# Patient Record
Sex: Male | Born: 2000 | Race: Asian | Hispanic: No | Marital: Single | Smoking: Never smoker
Health system: Southern US, Community
[De-identification: ages and names within clinical notes are randomized; demographics above are authoritative.]

---
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
## 2019-04-19 ENCOUNTER — Other Ambulatory Visit: Payer: Self-pay

## 2019-04-19 DIAGNOSIS — Z20822 Contact with and (suspected) exposure to covid-19: Secondary | ICD-10-CM

## 2019-04-21 LAB — NOVEL CORONAVIRUS, NAA: SARS-CoV-2, NAA: NOT DETECTED

## 2020-08-25 ENCOUNTER — Other Ambulatory Visit: Payer: Self-pay

## 2020-08-25 ENCOUNTER — Encounter: Payer: Self-pay | Admitting: Emergency Medicine

## 2020-08-25 ENCOUNTER — Emergency Department (INDEPENDENT_AMBULATORY_CARE_PROVIDER_SITE_OTHER)
Admission: EM | Admit: 2020-08-25 | Discharge: 2020-08-25 | Disposition: A | Discharge status: Home / Self Care | Payer: Medicaid Other | Source: Home / Self Care | Attending: Family Medicine | Admitting: Family Medicine

## 2020-08-25 DIAGNOSIS — J111 Influenza due to unidentified influenza virus with other respiratory manifestations: Secondary | ICD-10-CM

## 2020-08-25 MED ORDER — OSELTAMIVIR PHOSPHATE 75 MG PO CAPS: 75.0000 mg | ORAL_CAPSULE | Freq: Two times a day (BID) | ORAL | 0 refills | Status: AC

## 2020-08-25 NOTE — ED Provider Notes (Signed)
[facility]    CSN: [id number] Arrival date & time: 08/25/20  1422      History   Chief Complaint Chief Complaint  Patient presents with  . Sore Throat    HPI Shizuo Biskup is a 20 y.o. male.   Two days ago patient developed nasal drainage, headache, fatigue, and cough.  Yesterday he developed a sore throat, hoarseness, and chills.  He denies pleuritic pain or shortness of breath.  He has had COVID19 vaccinations.  The history is provided by the patient.    History reviewed. No pertinent past medical history.  There are no problems to display for this patient.   History reviewed. No pertinent surgical history.     Home Medications    Prior to Admission medications   Medication Sig Start Date End Date Taking? Authorizing Provider  cetirizine (ZYRTEC) 10 MG tablet Take 10 mg by mouth daily.   Yes [provider]  oseltamivir (TAMIFLU) 75 MG capsule Take 1 capsule (75 mg total) by mouth 2 (two) times daily for 5 days. 08/25/20 08/30/20 Yes Lattie Haw, MD  pseudoephedrine-acetaminophen (TYLENOL SINUS) 30-500 MG TABS tablet Take 1 tablet by mouth every 4 (four) hours as needed.   Yes [provider]    Family History Family History  Problem Relation Age of Onset  . Healthy Mother   . Diabetes Father     Social History Social History   Tobacco Use  . Smoking status: Never Smoker  . Smokeless tobacco: Never Used  Substance Use Topics  . Drug use: Not Currently     Allergies   Patient has no known allergies.   Review of Systems Review of Systems + sore throat + hoarse + cough No pleuritic pain No wheezing + nasal congestion + post-nasal drainage No sinus pain/pressure No itchy/red eyes No earache No hemoptysis No SOB No fever, + chills No nausea No vomiting No abdominal pain No diarrhea No urinary symptoms No skin rash + fatigue No myalgias + headache Used OTC meds (Theraflu) without relief    Physical Exam Triage Vital Signs ED Triage Vitals  Enc Vitals Group     BP 08/25/20 1441 104/70     Pulse Rate 08/25/20 1441 86     Resp 08/25/20 1441 20     Temp 08/25/20 1441 98.9 F (37.2 C)     Temp Source 08/25/20 1441 Oral     SpO2 08/25/20 1441 99 %     Weight 08/25/20 1442 280 lb (127 kg)     Height 08/25/20 1442 5\' 7"  (1.702 m)     Head Circumference --      Peak Flow --      Pain Score 08/25/20 1442 2     Pain Loc --      Pain Edu? --      Excl. in GC? --    No data found.  Updated Vital Signs BP 104/70 (BP Location: Right Arm)   Pulse 86   Temp 98.9 F (37.2 C) (Oral)   Resp 20   Ht 5\' 7"  (1.702 m)   Wt 127 kg   SpO2 99%   BMI 43.85 kg/m   Visual Acuity Right Eye Distance:   Left Eye Distance:   Bilateral Distance:    Right Eye Near:   Left Eye Near:    Bilateral Near:     Physical Exam Nursing notes and Vital Signs reviewed. Appearance:  Patient appears stated age, and in no acute distress  Eyes:  Pupils are equal, round, and reactive to light and accomodation.  Extraocular movement is intact.  Conjunctivae are not inflamed  Ears:  Canals normal.  Tympanic membranes normal.  Nose:  Mildly congested turbinates.  No sinus tenderness.  Pharynx:  Normal Neck:  Supple.  Mildly enlarged lateral nodes are present, tender to palpation on the left.   Lungs:  Clear to auscultation.  Breath sounds are equal.  Moving air well. Heart:  Regular rate and rhythm without murmurs, rubs, or gallops.  Abdomen:  Nontender without masses or hepatosplenomegaly.  Bowel sounds are present.  No CVA or flank tenderness.  Extremities:  No edema.  Skin:  No rash present.   UC Treatments / Results  Labs (all labs ordered are listed, but only abnormal results are displayed) Labs Reviewed - No data to display  EKG   Radiology No results found.  Procedures Procedures (including critical care time)  Medications Ordered in UC Medications - No data to  display  Initial Impression / Assessment and Plan / UC Course  I have reviewed the triage vital signs and the nursing notes.  Pertinent labs & imaging results that were available during my care of the patient were reviewed by me and considered in my medical decision making (see chart for details).    Benign exam.  There is no evidence of bacterial infection today.   COVID19 PCR and Flu A/B pending. Begin empiric Tamiflu. Followup with Family Doctor if not improved in one week.    Final Clinical Impressions(s) / UC Diagnoses   Final diagnoses:  Influenza-like illness     Discharge Instructions     Take plain guaifenesin (1200mg  extended release tabs such as Mucinex) twice daily, with plenty of water, for cough and congestion.  May add Pseudoephedrine (30mg , one or two every 4 to 6 hours) for sinus congestion.  Get adequate rest.   May use Afrin nasal spray (or generic oxymetazoline) each morning for about 5 days and then discontinue.  Also recommend using saline nasal spray several times daily and saline nasal irrigation (AYR is a common brand).  Use Flonase nasal spray each morning after using Afrin nasal spray and saline nasal irrigation. Try warm salt water gargles for sore throat.  Stop all antihistamines for now, and other non-prescription cough/cold preparations. May take Ibuprofen 200mg , 4 tabs every 8 hours with food for body aches, headache, fever, etc. May take Delsym Cough Suppressant ("12 Hour Cough Relief") at bedtime for nighttime cough.   If your COVID-19 test is positive, isolate yourself for five days from the time of your symptom onset.  At the end of five days you may end isolation if your symptoms have cleared or improved, and you have not had a fever for 24 hours. At this time you should wear a mask for five more days when you are around others.   If symptoms become significantly worse during the night or over the weekend, proceed to the local emergency room.          ED Prescriptions    Medication Sig Dispense Auth. Provider   oseltamivir (TAMIFLU) 75 MG capsule Take 1 capsule (75 mg total) by mouth 2 (two) times daily for 5 days. 10 capsule , MD        , MD 08/27/20 [id number]

## 2020-08-25 NOTE — ED Triage Notes (Signed)
Sore throat, chills,cough x 2days Vaccinated

## 2020-08-25 NOTE — Discharge Instructions (Addendum)
Take plain guaifenesin (1200mg  extended release tabs such as Mucinex) twice daily, with plenty of water, for cough and congestion.  May add Pseudoephedrine (30mg , one or two every 4 to 6 hours) for sinus congestion.  Get adequate rest.   May use Afrin nasal spray (or generic oxymetazoline) each morning for about 5 days and then discontinue.  Also recommend using saline nasal spray several times daily and saline nasal irrigation (AYR is a common brand).  Use Flonase nasal spray each morning after using Afrin nasal spray and saline nasal irrigation. Try warm salt water gargles for sore throat.  Stop all antihistamines for now, and other non-prescription cough/cold preparations. May take Ibuprofen 200mg , 4 tabs every 8 hours with food for body aches, headache, fever, etc. May take Delsym Cough Suppressant ("12 Hour Cough Relief") at bedtime for nighttime cough.   If your COVID-19 test is positive, isolate yourself for five days from the time of your symptom onset.  At the end of five days you may end isolation if your symptoms have cleared or improved, and you have not had a fever for 24 hours. At this time you should wear a mask for five more days when you are around others.   If symptoms become significantly worse during the night or over the weekend, proceed to the local emergency room.

## 2020-08-27 LAB — COVID-19, FLU A+B NAA
Influenza A, NAA: NOT DETECTED
Influenza B, NAA: NOT DETECTED
SARS-CoV-2, NAA: NOT DETECTED

## 2021-06-05 ENCOUNTER — Emergency Department (INDEPENDENT_AMBULATORY_CARE_PROVIDER_SITE_OTHER)
Admission: EM | Admit: 2021-06-05 | Discharge: 2021-06-05 | Disposition: A | Discharge status: Home / Self Care | Payer: BC Managed Care – PPO | Source: Home / Self Care | Attending: Family Medicine | Admitting: Family Medicine

## 2021-06-05 ENCOUNTER — Emergency Department (INDEPENDENT_AMBULATORY_CARE_PROVIDER_SITE_OTHER): Payer: BC Managed Care – PPO

## 2021-06-05 ENCOUNTER — Encounter: Payer: Self-pay | Admitting: Emergency Medicine

## 2021-06-05 ENCOUNTER — Other Ambulatory Visit: Payer: Self-pay

## 2021-06-05 DIAGNOSIS — R42 Dizziness and giddiness: Secondary | ICD-10-CM

## 2021-06-05 DIAGNOSIS — R0602 Shortness of breath: Secondary | ICD-10-CM | POA: Diagnosis not present

## 2021-06-05 DIAGNOSIS — I9589 Other hypotension: Secondary | ICD-10-CM

## 2021-06-05 LAB — POCT URINALYSIS DIP (MANUAL ENTRY)
Bilirubin, UA: NEGATIVE
Blood, UA: NEGATIVE
Glucose, UA: NEGATIVE mg/dL
Ketones, POC UA: NEGATIVE mg/dL
Leukocytes, UA: NEGATIVE
Nitrite, UA: NEGATIVE
Protein Ur, POC: NEGATIVE mg/dL
Spec Grav, UA: 1.025 (ref 1.010–1.025)
Urobilinogen, UA: 0.2 E.U./dL
pH, UA: 7 (ref 5.0–8.0)

## 2021-06-05 LAB — POCT INFLUENZA A/B
Influenza A, POC: NEGATIVE
Influenza B, POC: NEGATIVE

## 2021-06-05 LAB — POC SARS CORONAVIRUS 2 AG -  ED: SARS Coronavirus 2 Ag: NEGATIVE

## 2021-06-05 LAB — POCT FASTING CBG KUC MANUAL ENTRY: POCT Glucose (KUC): 94 mg/dL (ref 70–99)

## 2021-06-05 NOTE — Discharge Instructions (Signed)
The EKG is normal The chest x-ray is normal Your blood sugar is normal The urine test does show you could drink more water, although you are not dehydrated Your blood work will be available tomorrow You need to reduce the strenuous exercise, stop as soon as you feel lightheaded or dizzy 1 or 2 days off a week is advisable Follow-up with your primary care doctor

## 2021-06-05 NOTE — ED Triage Notes (Signed)
Dizziness, headache, diarrhea works out a lot, eats healthy only drinks alcohol on weekends. Has been sick for 3 weeks. Vaccinated for Covid no flu shot. One episode of diarrhea in 24 hours.

## 2021-06-05 NOTE — ED Provider Notes (Signed)
[facility]    CSN: [id number] Arrival date & time: 06/05/21  1526      History   Chief Complaint Chief Complaint  Patient presents with   Dizziness    HPI Juan Powell is a 21 y.o. male.   HPI  Patient is a healthy 21 year old man on no medicine except nutrition supplements.  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  No ongoing medical problems For the last 2 weeks or so he has had increasing difficulty with his workouts.  He states that when he works out he feels very weak and tired.  Has to sit down and cool air.  Feels like he will faint.  Feels lightheaded.  No chest pain.  No shortness of breath.  No recent illness.  No fever or chills.  He has a decreased appetite.  Has vomited a few times.  He has had diarrhea a few times.  He tries to drink enough water and does not think he is dehydrated His sleep habits have not changed.  He tends to stay up late He does not think his weight is changed His exercise tolerance has not changed, he states he lifts the same amount of weight He has not had any signs or symptoms of infection such as fever or chills, headaches or body aches, cough cold or runny nose He states his muscles are sore the morning after he works out.  This does not limit his daily exercise  He did have an endocrinology visit when he was 21 years old.  He was seen for delayed growth.  He did have a acanthosis nigricans and a hemoglobin A1c of 5.6.  Endocrinologist predicted higher risk of diabetes He does not have polydipsia or polyuria  History reviewed. No pertinent past medical history.  There are no problems to display for this patient.   History reviewed. No pertinent surgical history.     Home Medications    Prior to Admission medications   Medication Sig Start Date End Date Taking? Authorizing Provider  ibuprofen (ADVIL) 400 MG tablet Take 400 mg by mouth  every 6 (six) hours as needed.   Yes [provider]    Family History Family History  Problem Relation Age of Onset   Healthy Mother    Diabetes Father     Social History Social History   Tobacco Use   Smoking status: Never   Smokeless tobacco: Never  Vaping Use   Vaping Use: Never used  Substance Use Topics   Alcohol use: Yes   Drug use: Not Currently     Allergies   Patient has no known allergies.   Review of Systems Review of Systems  See HPI Physical Exam Triage Vital Signs ED Triage Vitals  Enc Vitals Group     BP 06/05/21 1540 105/72     Pulse Rate 06/05/21 1540 78     Resp 06/05/21 1540 18     Temp 06/05/21 1540 98.4 F (36.9 C)     Temp Source 06/05/21 1540 Oral     SpO2 06/05/21 1540 97 %     Weight 06/05/21 1541 280 lb (127 kg)     Height 06/05/21 1541 5\' 8"  (1.727 m)     Head Circumference --      Peak Flow --      Pain Score 06/05/21 1541 0  Pain Loc --      Pain Edu? --      Excl. in GC? --    Orthostatic VS for the past 24 hrs:  BP- Lying Pulse- Lying BP- Sitting Pulse- Sitting BP- Standing at 0 minutes Pulse- Standing at 0 minutes  06/05/21 1644 110/76 60 106/73 71 112/74 78    Updated Vital Signs BP 105/72 (BP Location: Left Arm)    Pulse 78    Temp 98.4 F (36.9 C) (Oral)    Resp 18    Ht 5\' 8"  (1.727 m)    Wt 127 kg    SpO2 97%    BMI 42.57 kg/m      Physical Exam Constitutional:      General: He is not in acute distress.    Appearance: He is well-developed. He is obese.     Comments: Patient is stocky and muscular, but does have extra weight.  Mildly obese  HENT:     Head: Normocephalic and atraumatic.     Right Ear: Tympanic membrane and ear canal normal.     Left Ear: Tympanic membrane and ear canal normal.     Nose: Nose normal.     Mouth/Throat:     Mouth: Mucous membranes are moist.     Pharynx: No posterior oropharyngeal erythema.  Eyes:     Conjunctiva/sclera: Conjunctivae normal.     Pupils: Pupils  are equal, round, and reactive to light.  Cardiovascular:     Rate and Rhythm: Normal rate and regular rhythm.     Heart sounds: Normal heart sounds.  Pulmonary:     Effort: Pulmonary effort is normal. No respiratory distress.     Breath sounds: Normal breath sounds. No wheezing, rhonchi or rales.  Abdominal:     General: There is no distension.     Palpations: Abdomen is soft.     Tenderness: There is no abdominal tenderness.     Hernia: No hernia is present.     Comments: No tenderness.  No organomegaly  Musculoskeletal:        General: No swelling or tenderness. Normal range of motion.     Cervical back: Normal range of motion.     Right lower leg: No edema.     Left lower leg: No edema.  Lymphadenopathy:     Cervical: No cervical adenopathy.  Skin:    General: Skin is warm and dry.     Findings: No rash.  Neurological:     General: No focal deficit present.     Mental Status: He is alert.     Gait: Gait normal.  Psychiatric:        Mood and Affect: Mood normal.        Behavior: Behavior normal.     UC Treatments / Results  Labs (all labs ordered are listed, but only abnormal results are displayed) Labs Reviewed  CBC WITH DIFFERENTIAL/PLATELET  COMPLETE METABOLIC PANEL WITH GFR  CK  POCT URINALYSIS DIP (MANUAL ENTRY)  POCT FASTING CBG K[facility] MANUAL ENTRY  POCT INFLUENZA A/B  POC SARS CORONAVIRUS 2 AG -  ED    EKG   Radiology DG Chest 2 View  Result Date: 06/05/2021 CLINICAL DATA:  Shortness of breath and dizziness with exertion. EXAM: CHEST - 2 VIEW COMPARISON:  None. FINDINGS: The heart size and mediastinal contours are within normal limits. Both lungs are clear. The visualized skeletal structures are unremarkable. IMPRESSION: No active cardiopulmonary disease. Electronically Signed   By: 06/07/2021  Derry M.D.   On: 06/05/2021 16:58    Procedures ED EKG  Date/Time: 06/05/2021 5:03 PM Performed by: Eustace MooreNelson, Lambros Cerro Sue, MD Authorized by: Eustace MooreNelson, Parvin Stetzer Sue, MD    ECG reviewed by ED Physician in the absence of a cardiologist: no   Previous ECG:    Previous ECG:  Unavailable Interpretation:    Interpretation: normal   Rate:    ECG rate:  63   ECG rate assessment: normal   Rhythm:    Rhythm: sinus rhythm   QRS:    QRS axis:  Normal   QRS intervals:  Normal   QRS conduction: normal   ST segments:    ST segments:  Normal T waves:    T waves: normal   Q waves:    Abnormal Q-waves: not present   (including critical care time)  Medications Ordered in UC Medications - No data to display  Initial Impression / Assessment and Plan / UC Course  I have reviewed the triage vital signs and the nursing notes.  Pertinent labs & imaging results that were available during my care of the patient were reviewed by me and considered in my medical decision making (see chart for details).     Exercise-induced dizziness is worrisome to me.  At peak exercise his cardiovascular system should be maintaining good circulatory status.  He may have a virus or a temporary condition.  All testing today is negative.  Follow-up with usual primary care. Final Clinical Impressions(s) / UC Diagnoses   Final diagnoses:  Dizzy  Exercise induced hypotension     Discharge Instructions      The EKG is normal The chest x-ray is normal Your blood sugar is normal The urine test does show you could drink more water, although you are not dehydrated Your blood work will be available tomorrow You need to reduce the strenuous exercise, stop as soon as you feel lightheaded or dizzy 1 or 2 days off a week is advisable Follow-up with your primary care doctor     ED Prescriptions   None    PDMP not reviewed this encounter.   Eustace MooreNelson, Emy Angevine Sue, MD 06/05/21 [phone]

## 2021-06-06 LAB — CBC WITH DIFFERENTIAL/PLATELET
Absolute Monocytes: 422 cells/uL (ref 200–950)
Basophils Absolute: 82 cells/uL (ref 0–200)
Basophils Relative: 1.2 %
Eosinophils Absolute: 428 cells/uL (ref 15–500)
Eosinophils Relative: 6.3 %
HCT: 44.2 % (ref 38.5–50.0)
Hemoglobin: 14.9 g/dL (ref 13.2–17.1)
Lymphs Abs: 1625 cells/uL (ref 850–3900)
MCH: 29 pg (ref 27.0–33.0)
MCHC: 33.7 g/dL (ref 32.0–36.0)
MCV: 86 fL (ref 80.0–100.0)
MPV: 10.4 fL (ref 7.5–12.5)
Monocytes Relative: 6.2 %
Neutro Abs: 4243 cells/uL (ref 1500–7800)
Neutrophils Relative %: 62.4 %
Platelets: 299 10*3/uL (ref 140–400)
RBC: 5.14 10*6/uL (ref 4.20–5.80)
RDW: 12.6 % (ref 11.0–15.0)
Total Lymphocyte: 23.9 %
WBC: 6.8 10*3/uL (ref 3.8–10.8)

## 2021-06-06 LAB — COMPLETE METABOLIC PANEL WITH GFR
AG Ratio: 1.4 (calc) (ref 1.0–2.5)
ALT: 17 U/L (ref 9–46)
AST: 18 U/L (ref 10–40)
Albumin: 4.7 g/dL (ref 3.6–5.1)
Alkaline phosphatase (APISO): 82 U/L (ref 36–130)
BUN: 18 mg/dL (ref 7–25)
CO2: 29 mmol/L (ref 20–32)
Calcium: 9.8 mg/dL (ref 8.6–10.3)
Chloride: 105 mmol/L (ref 98–110)
Creat: 1.23 mg/dL (ref 0.60–1.24)
Globulin: 3.3 g/dL (calc) (ref 1.9–3.7)
Glucose, Bld: 83 mg/dL (ref 65–99)
Potassium: 4.8 mmol/L (ref 3.5–5.3)
Sodium: 140 mmol/L (ref 135–146)
Total Bilirubin: 0.3 mg/dL (ref 0.2–1.2)
Total Protein: 8 g/dL (ref 6.1–8.1)
eGFR: 86 mL/min/{1.73_m2} (ref >=60)

## 2021-06-06 LAB — CK: Total CK: 153 U/L (ref 44–196)

## 2022-09-18 IMAGING — DX DG CHEST 2V
2 series · 2 of 2 positions shown · non-contrast
Comparison: None.

CLINICAL DATA: Shortness of breath and dizziness with exertion.

EXAM:
CHEST - 2 VIEW

[chest pa]
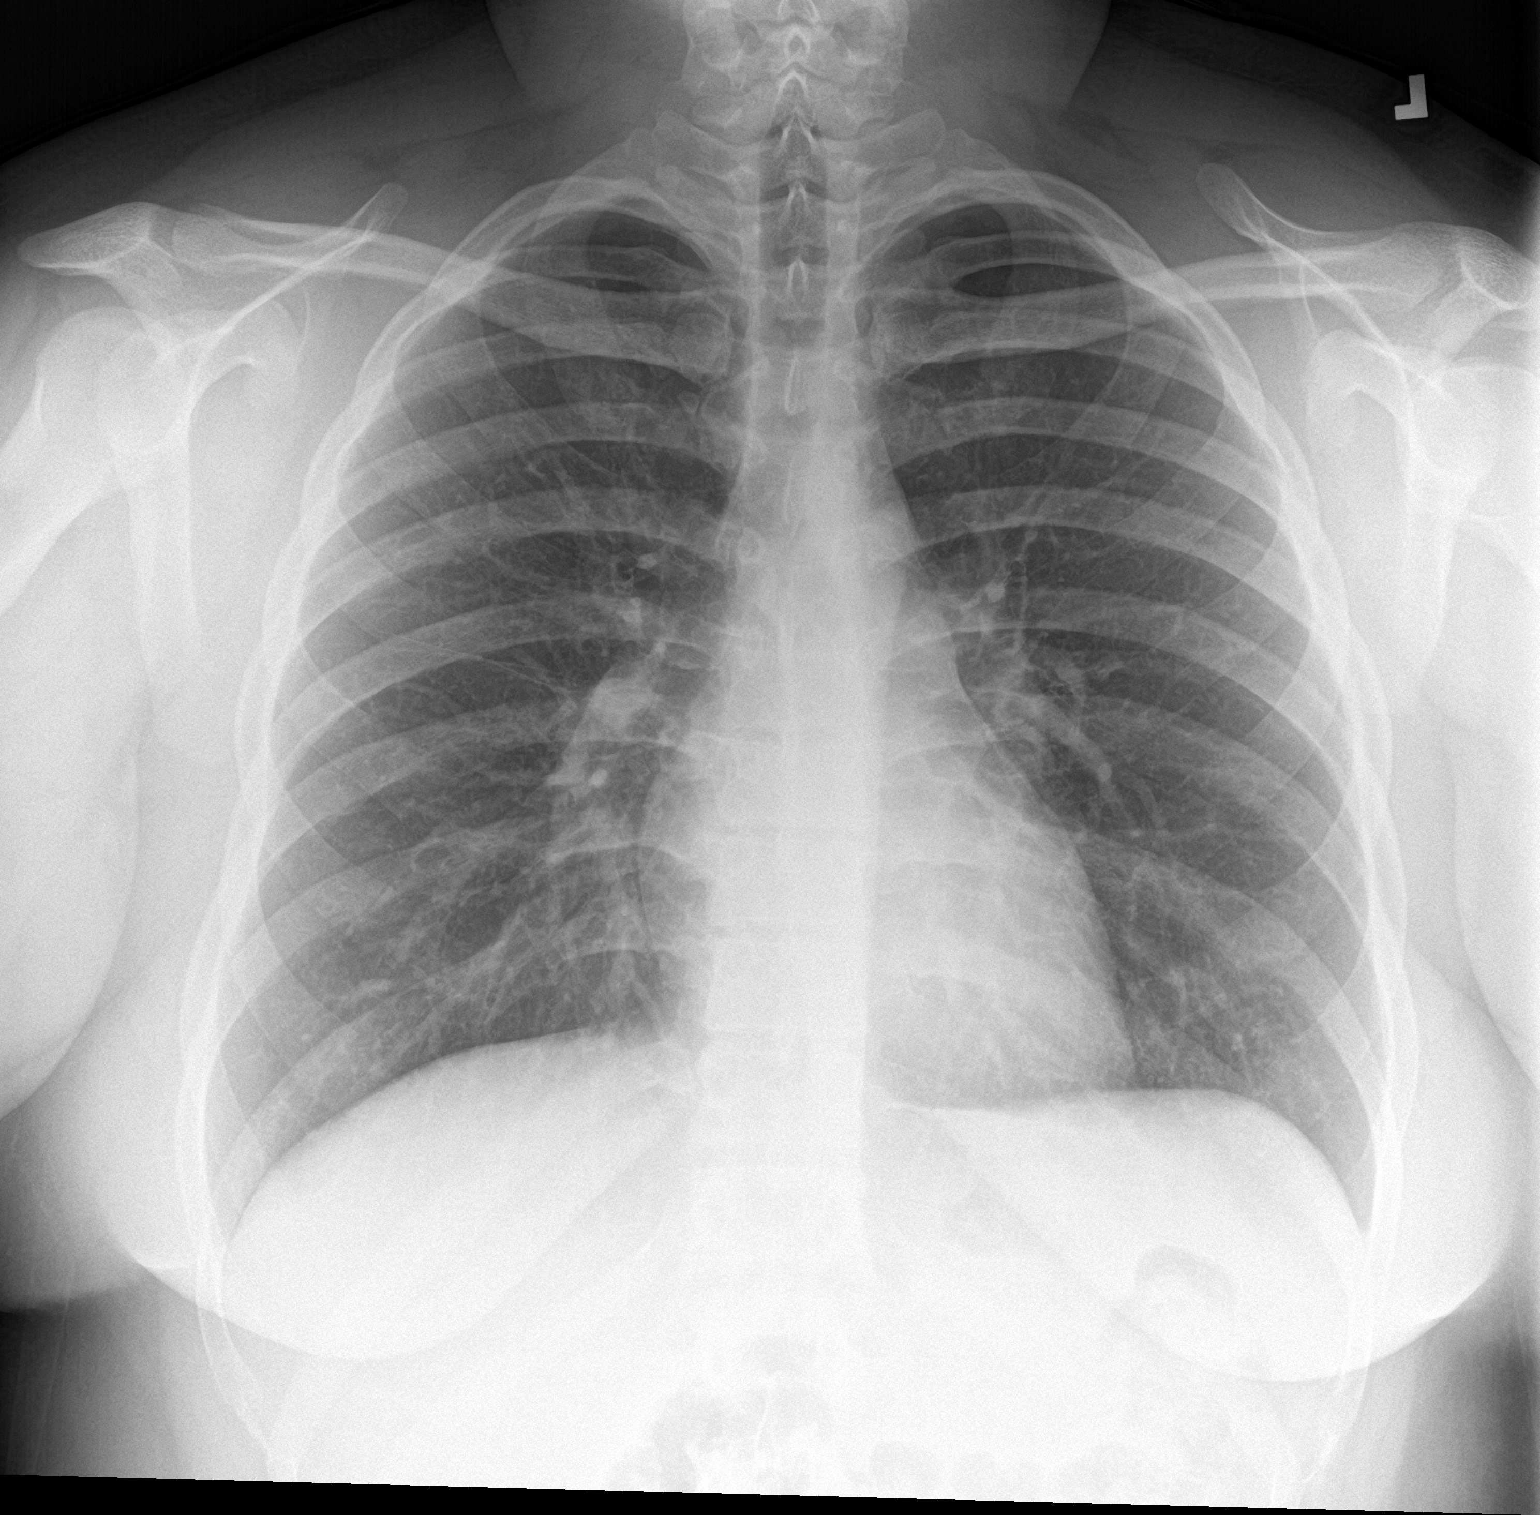

[chest lat]
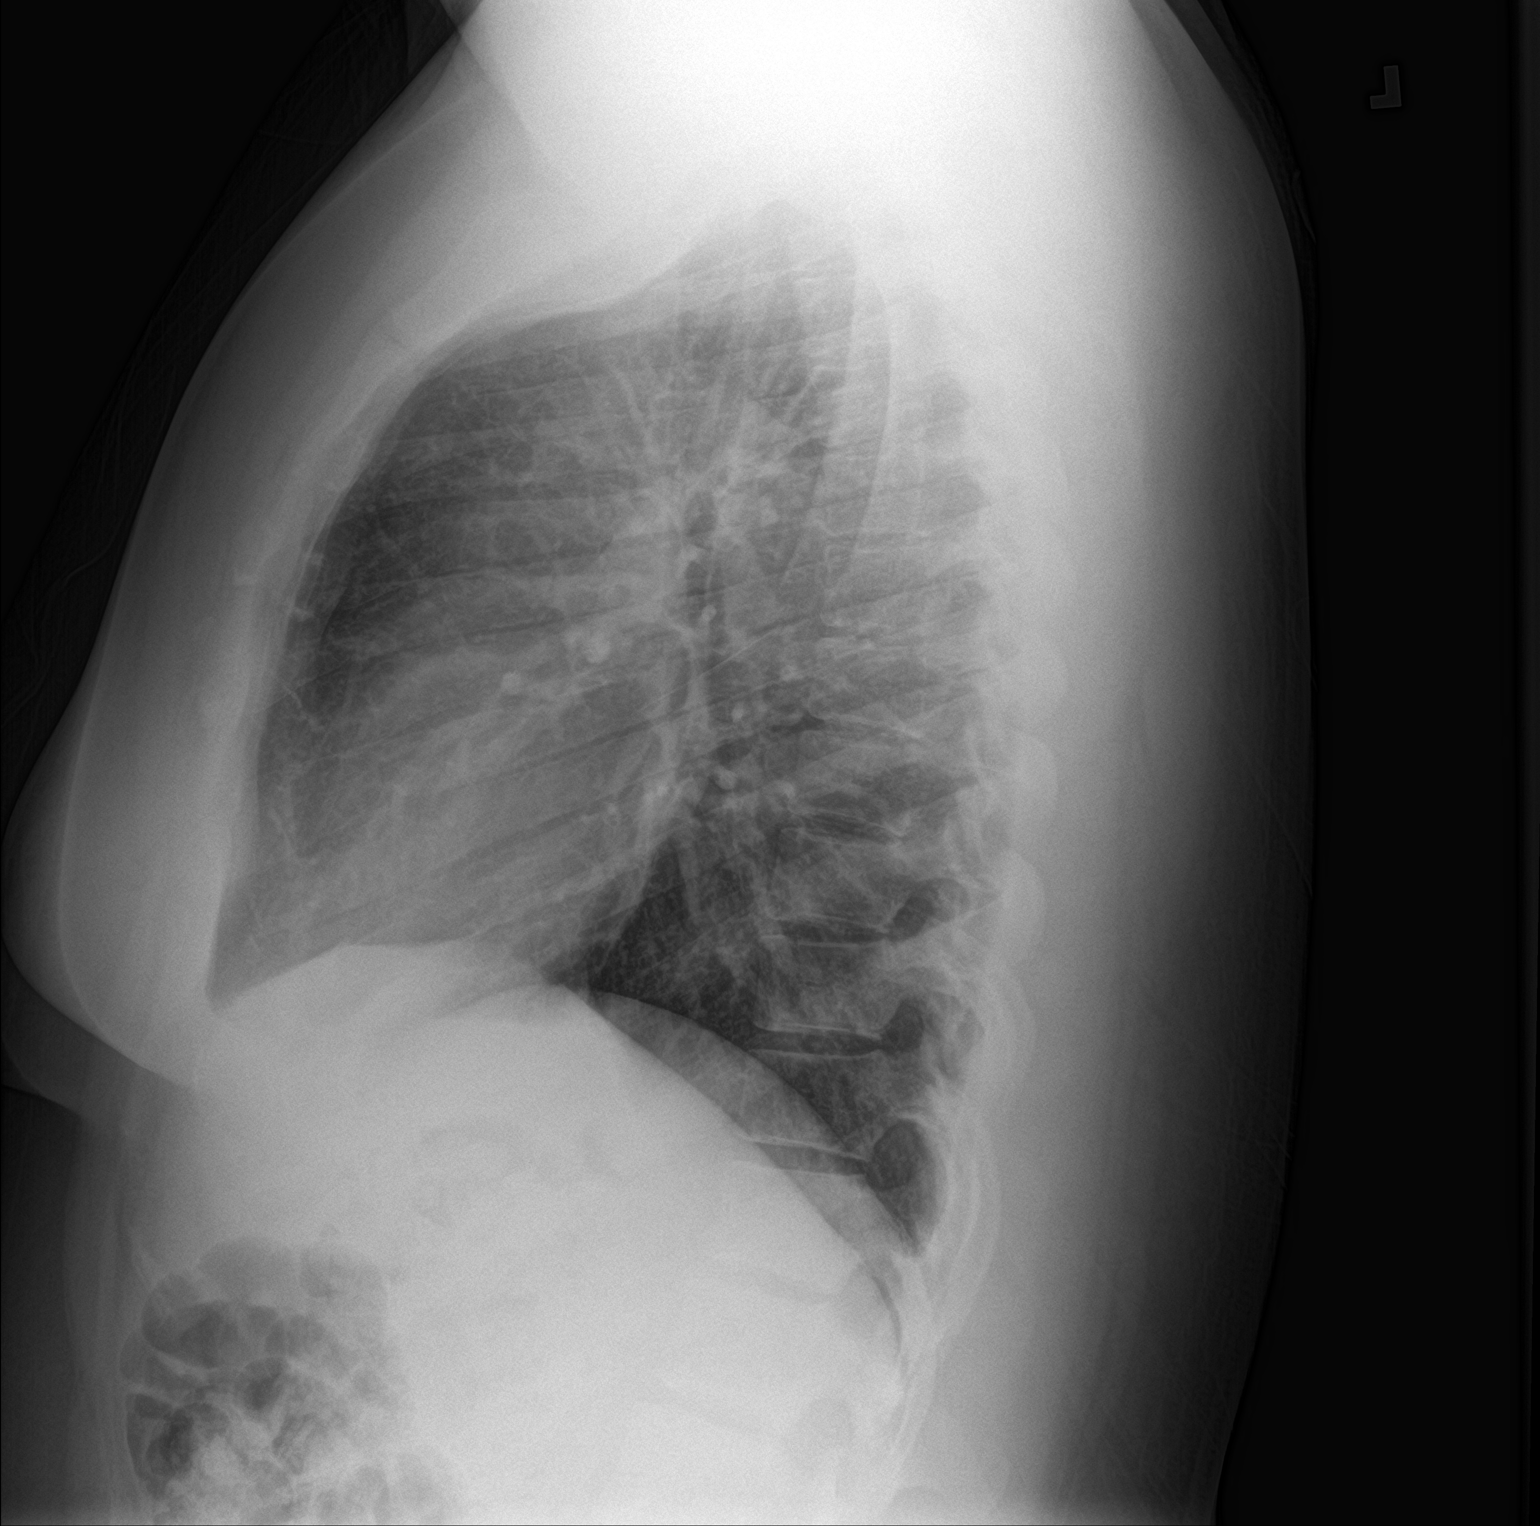

[2 of 2 positions shown; findings below may reference images not displayed]

FINDINGS: The heart size and mediastinal contours are within normal limits.
Both lungs are clear. The visualized skeletal structures are
unremarkable.
IMPRESSION: No active cardiopulmonary disease.

## 2023-10-25 ENCOUNTER — Ambulatory Visit
Admission: EM | Admit: 2023-10-25 | Discharge: 2023-10-25 | Disposition: A | Discharge status: Home / Self Care | Attending: Family Medicine | Admitting: Family Medicine

## 2023-10-25 ENCOUNTER — Encounter: Payer: Self-pay | Admitting: Emergency Medicine

## 2023-10-25 DIAGNOSIS — R238 Other skin changes: Secondary | ICD-10-CM | POA: Diagnosis not present

## 2023-10-25 DIAGNOSIS — L03818 Cellulitis of other sites: Secondary | ICD-10-CM | POA: Diagnosis not present

## 2023-10-25 MED ORDER — CEPHALEXIN 500 MG PO CAPS: 500.0000 mg | ORAL_CAPSULE | Freq: Two times a day (BID) | ORAL | 0 refills | Status: DC

## 2023-10-25 MED ORDER — PREDNISONE 10 MG (21) PO TBPK: ORAL_TABLET | Freq: Every day | ORAL | 0 refills | Status: DC

## 2023-10-25 NOTE — ED Provider Notes (Signed)
 [facility]    CSN: [id number] Arrival date & time: 10/25/23  1113      History   Chief Complaint Chief Complaint  Patient presents with   Insect Bite    HPI Juan Powell is a 23 y.o. male.   HPI  Patient states he went on a hike 3 days ago.  He was outdoors for a long period of time.  By the time he got back to his car he noticed that his lower legs were itching.  Over the last couple of days the itching is gotten worse, he has a widespread rash, lots of blisters, fluid leaking down his legs.  Increasing discomfort.  History reviewed. No pertinent past medical history.  There are no active problems to display for this patient.   History reviewed. No pertinent surgical history.     Home Medications    Prior to Admission medications   Medication Sig Start Date End Date Taking? Authorizing Provider  cephALEXin (KEFLEX) 500 MG capsule Take 1 capsule (500 mg total) by mouth 2 (two) times daily. 10/25/23  Yes Maranda Jamee Jacob, MD  predniSONE (STERAPRED UNI-PAK 21 TAB) 10 MG (21) TBPK tablet Take by mouth daily. Take 6 tabs by mouth daily  for 2 days, then 5 tabs for 2 days, then 4 tabs for 2 days, then 3 tabs for 2 days, 2 tabs for 2 days, then 1 tab by mouth daily for 2 days 10/25/23  Yes Maranda Jamee Jacob, MD  ibuprofen (ADVIL) 400 MG tablet Take 400 mg by mouth every 6 (six) hours as needed.    [provider]    Family History Family History  Problem Relation Age of Onset   Healthy Mother    Diabetes Father     Social History Social History   Tobacco Use   Smoking status: Never   Smokeless tobacco: Never  Vaping Use   Vaping status: Never Used  Substance Use Topics   Alcohol use: Yes   Drug use: Not Currently     Allergies   Patient has no known allergies.   Review of Systems Review of Systems See HPI  Physical Exam Triage Vital Signs ED Triage Vitals  Encounter Vitals Group     BP 10/25/23 1127 113/76     Girls  Systolic BP Percentile --      Girls Diastolic BP Percentile --      Boys Systolic BP Percentile --      Boys Diastolic BP Percentile --      Pulse Rate 10/25/23 1127 81     Resp 10/25/23 1127 18     Temp 10/25/23 1127 98.7 F (37.1 C)     Temp Source 10/25/23 1127 Oral     SpO2 10/25/23 1127 97 %     Weight 10/25/23 1128 257 lb (116.6 kg)     Height 10/25/23 1128 5' 7 (1.702 m)     Head Circumference --      Peak Flow --      Pain Score 10/25/23 1128 3     Pain Loc --      Pain Education --      Exclude from Growth Chart --    No data found.  Updated Vital Signs BP 113/76 (BP Location: Right Arm)   Pulse 81   Temp 98.7 F (37.1 C) (Oral)   Resp 18   Ht 5' 7 (1.702 m)   Wt 116.6 kg   SpO2 97%   BMI  40.25 kg/m       Physical Exam Constitutional:      General: He is not in acute distress.    Appearance: Normal appearance. He is well-developed.     Comments: Stocky and muscular  HENT:     Head: Normocephalic and atraumatic.   Eyes:     Conjunctiva/sclera: Conjunctivae normal.     Pupils: Pupils are equal, round, and reactive to light.    Cardiovascular:     Rate and Rhythm: Normal rate.  Pulmonary:     Effort: Pulmonary effort is normal. No respiratory distress.  Abdominal:     General: There is no distension.     Palpations: Abdomen is soft.   Musculoskeletal:        General: Normal range of motion.     Cervical back: Normal range of motion.   Skin:    General: Skin is warm and dry.     Findings: Erythema and rash present.     Comments: Medial portion of both lower legs has a large open area measuring 10 x 15 cm across with vesicles, open and closed, serous fluid dripping down legs.  Multiple smaller lesions surrounding these large areas   Neurological:     Mental Status: He is alert.      UC Treatments / Results  Labs (all labs ordered are listed, but only abnormal results are displayed) Labs Reviewed - No data to  display  EKG   Radiology No results found.  Procedures Procedures (including critical care time)  Medications Ordered in UC Medications - No data to display  Initial Impression / Assessment and Plan / UC Course  I have reviewed the triage vital signs and the nursing notes.  Pertinent labs & imaging results that were available during my care of the patient were reviewed by me and considered in my medical decision making (see chart for details).     Concerned that there is some erythema ascending above the rash on both calves.  I am worried about infection setting in.  Will treat with steroids, ice, cool compresses, antibiotics.  Limit walking while painful.  Return if worse at any time Final Clinical Impressions(s) / UC Diagnoses   Final diagnoses:  Vesicular rash  Cellulitis of other specified site     Discharge Instructions      Limit walking while eyes are painful Keep cool, avoid the heat and humidity Take prednisone as directed.  Take all of day 1 today Take Keflex 2 times a day Wash once or twice a day with mild soap and water   ED Prescriptions     Medication Sig Dispense Auth. Provider   cephALEXin (KEFLEX) 500 MG capsule Take 1 capsule (500 mg total) by mouth 2 (two) times daily. 10 capsule Maranda Jamee Jacob, MD   predniSONE (STERAPRED UNI-PAK 21 TAB) 10 MG (21) TBPK tablet Take by mouth daily. Take 6 tabs by mouth daily  for 2 days, then 5 tabs for 2 days, then 4 tabs for 2 days, then 3 tabs for 2 days, 2 tabs for 2 days, then 1 tab by mouth daily for 2 days 42 tablet Maranda Jamee Jacob, MD      PDMP not reviewed this encounter.   Maranda Jamee Jacob, MD 10/25/23 1155

## 2023-10-25 NOTE — Discharge Instructions (Signed)
 Limit walking while eyes are painful Keep cool, avoid the heat and humidity Take prednisone as directed.  Take all of day 1 today Take Keflex 2 times a day Wash once or twice a day with mild soap and water

## 2023-10-25 NOTE — ED Triage Notes (Signed)
 Patient states that he went on a hike 3 days ago.  Now he has these red bites on both lower legs, blisters with drainage, very itchy.  Patient has been cleaning area, applying a cream to the legs that's from [employer] which seems to help.  Also wrapping the areas in a clear wrap.

## 2023-11-12 ENCOUNTER — Encounter: Payer: Self-pay | Admitting: Emergency Medicine

## 2023-11-12 ENCOUNTER — Ambulatory Visit
Admission: EM | Admit: 2023-11-12 | Discharge: 2023-11-12 | Disposition: A | Discharge status: Home / Self Care | Attending: Family Medicine | Admitting: Family Medicine

## 2023-11-12 DIAGNOSIS — R1110 Vomiting, unspecified: Secondary | ICD-10-CM | POA: Diagnosis not present

## 2023-11-12 MED ORDER — ONDANSETRON HCL 8 MG PO TABS: 8.0000 mg | ORAL_TABLET | Freq: Three times a day (TID) | ORAL | 0 refills | Status: AC | PRN

## 2023-11-12 MED ORDER — ONDANSETRON 4 MG PO TBDP
4.0000 mg | ORAL_TABLET | Freq: Once | ORAL | Status: AC
  Administered 2023-11-12: 4 mg via ORAL

## 2023-11-12 NOTE — ED Triage Notes (Signed)
 Patient c/o vomiting x 24 hours, no diarrhea, no BM x 2 days.  Patient able to tolerate bone broth.  Has taken Pepto Bismol and TUMS.  Fatigue.  Afebrile.

## 2023-11-12 NOTE — ED Provider Notes (Signed)
 [facility]    CSN: [id number] Arrival date & time: 11/12/23  1327      History   Chief Complaint Chief Complaint  Patient presents with   Emesis    HPI Juan Powell is a 23 y.o. male.   HPI  Patient states he feels he got food poisoning over the holiday Fourth of July weekend.  He states has been out of work all week with vomiting.  Diarrhea.  Abdominal crampy pain.  He feels weak.  He states that he is able to keep down some fluids and broth.  Needs a note for work.  He has had some sweats and chills but has not measured any fever.  No blood in stool or emesis.  No one else in family is sick  History reviewed. No pertinent past medical history.  There are no active problems to display for this patient.   History reviewed. No pertinent surgical history.     Home Medications    Prior to Admission medications   Medication Sig Start Date End Date Taking? Authorizing Provider  ondansetron  (ZOFRAN ) 8 MG tablet Take 1 tablet (8 mg total) by mouth every 8 (eight) hours as needed for nausea or vomiting. 11/12/23  Yes Maranda Jamee Jacob, MD    Family History Family History  Problem Relation Age of Onset   Healthy Mother    Diabetes Father     Social History Social History   Tobacco Use   Smoking status: Never   Smokeless tobacco: Never  Vaping Use   Vaping status: Never Used  Substance Use Topics   Alcohol use: Yes   Drug use: Not Currently     Allergies   Patient has no known allergies.   Review of Systems Review of Systems See HPI  Physical Exam Triage Vital Signs ED Triage Vitals  Encounter Vitals Group     BP 11/12/23 1340 107/74     Girls Systolic BP Percentile --      Girls Diastolic BP Percentile --      Boys Systolic BP Percentile --      Boys Diastolic BP Percentile --      Pulse Rate 11/12/23 1340 88     Resp 11/12/23 1340 18     Temp 11/12/23 1340 98 F (36.7 C)     Temp Source 11/12/23 1340 Oral     SpO2 11/12/23  1340 96 %     Weight --      Height --      Head Circumference --      Peak Flow --      Pain Score 11/12/23 1341 0     Pain Loc --      Pain Education --      Exclude from Growth Chart --    No data found.  Updated Vital Signs BP 107/74 (BP Location: Right Arm)   Pulse 88   Temp 98 F (36.7 C) (Oral)   Resp 18   SpO2 96%       Physical Exam Constitutional:      General: He is not in acute distress.    Appearance: He is well-developed and normal weight.  HENT:     Head: Normocephalic and atraumatic.     Mouth/Throat:     Mouth: Mucous membranes are moist.  Eyes:     Conjunctiva/sclera: Conjunctivae normal.     Pupils: Pupils are equal, round, and reactive to light.  Cardiovascular:     Rate and  Rhythm: Normal rate.  Pulmonary:     Effort: Pulmonary effort is normal. No respiratory distress.  Abdominal:     General: There is no distension.     Palpations: Abdomen is soft.  Musculoskeletal:        General: Normal range of motion.     Cervical back: Normal range of motion.  Skin:    General: Skin is warm and dry.  Neurological:     Mental Status: He is alert.      UC Treatments / Results  Labs (all labs ordered are listed, but only abnormal results are displayed) Labs Reviewed - No data to display  EKG   Radiology No results found.  Procedures Procedures (including critical care time)  Medications Ordered in UC Medications  ondansetron  (ZOFRAN -ODT) disintegrating tablet 4 mg (4 mg Oral Given 11/12/23 1343)    Initial Impression / Assessment and Plan / UC Course  I have reviewed the triage vital signs and the nursing notes.  Pertinent labs & imaging results that were available during my care of the patient were reviewed by me and considered in my medical decision making (see chart for details).     Vital signs are stable.  Patient is not orthostatic.  Discussed rehydration.  Prescription for Zofran  is given. Final Clinical Impressions(s) / UC  Diagnoses   Final diagnoses:  Uncontrollable vomiting     Discharge Instructions      Take Zofran  3 times a day for nausea and vomiting Make sure you are drinking lots of fluids.  Try to drink Gatorade, Powerade, and rehydration fluids with electrolytes May start bland food as soon as the vomiting stops Get plenty of rest Call if not able to go back to work by Friday   ED Prescriptions     Medication Sig Dispense Auth. Provider   ondansetron  (ZOFRAN ) 8 MG tablet Take 1 tablet (8 mg total) by mouth every 8 (eight) hours as needed for nausea or vomiting. 20 tablet Maranda Jamee Jacob, MD      PDMP not reviewed this encounter.   Maranda Jamee Jacob, MD 11/12/23 [id number]

## 2023-11-12 NOTE — Discharge Instructions (Signed)
 Take Zofran  3 times a day for nausea and vomiting Make sure you are drinking lots of fluids.  Try to drink Gatorade, Powerade, and rehydration fluids with electrolytes May start bland food as soon as the vomiting stops Get plenty of rest Call if not able to go back to work by Friday

## 2023-12-08 ENCOUNTER — Encounter (HOSPITAL_BASED_OUTPATIENT_CLINIC_OR_DEPARTMENT_OTHER): Payer: Self-pay | Admitting: Emergency Medicine

## 2023-12-08 ENCOUNTER — Other Ambulatory Visit: Payer: Self-pay

## 2023-12-08 ENCOUNTER — Emergency Department (HOSPITAL_BASED_OUTPATIENT_CLINIC_OR_DEPARTMENT_OTHER)
Admission: EM | Admit: 2023-12-08 | Discharge: 2023-12-08 | Disposition: A | Discharge status: Home / Self Care | Attending: Emergency Medicine | Admitting: Emergency Medicine

## 2023-12-08 DIAGNOSIS — R0602 Shortness of breath: Secondary | ICD-10-CM | POA: Diagnosis not present

## 2023-12-08 DIAGNOSIS — R112 Nausea with vomiting, unspecified: Secondary | ICD-10-CM | POA: Diagnosis present

## 2023-12-08 DIAGNOSIS — R5381 Other malaise: Secondary | ICD-10-CM

## 2023-12-08 DIAGNOSIS — E860 Dehydration: Secondary | ICD-10-CM | POA: Diagnosis not present

## 2023-12-08 DIAGNOSIS — R197 Diarrhea, unspecified: Secondary | ICD-10-CM | POA: Insufficient documentation

## 2023-12-08 LAB — URINALYSIS, ROUTINE W REFLEX MICROSCOPIC
Glucose, UA: NEGATIVE mg/dL
Ketones, ur: 80 mg/dL — AB
Leukocytes,Ua: NEGATIVE
Nitrite: NEGATIVE
Protein, ur: NEGATIVE mg/dL
Specific Gravity, Urine: 1.025 (ref 1.005–1.030)
pH: 6 (ref 5.0–8.0)

## 2023-12-08 LAB — CBC
HCT: 44.2 % (ref 39.0–52.0)
Hemoglobin: 15.2 g/dL (ref 13.0–17.0)
MCH: 28.8 pg (ref 26.0–34.0)
MCHC: 34.4 g/dL (ref 30.0–36.0)
MCV: 83.7 fL (ref 80.0–100.0)
Platelets: 311 K/uL (ref 150–400)
RBC: 5.28 MIL/uL (ref 4.22–5.81)
RDW: 12.8 % (ref 11.5–15.5)
WBC: 7.4 K/uL (ref 4.0–10.5)
nRBC: 0 % (ref 0.0–0.2)

## 2023-12-08 LAB — COMPREHENSIVE METABOLIC PANEL WITH GFR
ALT: 10 U/L (ref 0–44)
AST: 18 U/L (ref 15–41)
Albumin: 5.1 g/dL — ABNORMAL HIGH (ref 3.5–5.0)
Alkaline Phosphatase: 78 U/L (ref 38–126)
Anion gap: 16 — ABNORMAL HIGH (ref 5–15)
BUN: 14 mg/dL (ref 6–20)
CO2: 21 mmol/L — ABNORMAL LOW (ref 22–32)
Calcium: 10.3 mg/dL (ref 8.9–10.3)
Chloride: 102 mmol/L (ref 98–111)
Creatinine, Ser: 1.18 mg/dL (ref 0.61–1.24)
GFR, Estimated: >60 mL/min (ref >60)
Glucose, Bld: 85 mg/dL (ref 70–99)
Potassium: 3.6 mmol/L (ref 3.5–5.1)
Sodium: 138 mmol/L (ref 135–145)
Total Bilirubin: 0.7 mg/dL (ref 0.0–1.2)
Total Protein: 8.7 g/dL — ABNORMAL HIGH (ref 6.5–8.1)

## 2023-12-08 LAB — URINALYSIS, MICROSCOPIC (REFLEX)

## 2023-12-08 LAB — LIPASE, BLOOD: Lipase: 37 U/L (ref 11–51)

## 2023-12-08 NOTE — ED Provider Notes (Signed)
 [facility] Provider Note   CSN: [id number] Arrival date & time: 12/08/23  2058     Patient presents with: Emesis and Fatigue   Juan Powell is a 22 y.o. male.   The history is provided by the patient and a parent.   Patient is otherwise healthy at baseline.  He reports that for the past month he has had intermittent fatigue and nausea.  He reports he had food poisoning around July 4 and was seen in urgent care.  That included vomiting and diarrhea that improved.  However since that time he had intermittent episodes of nausea in the morning and occasional nonbloody emesis.  He will also have occasional diarrhea.  He also reports fatigue and mild shortness of breath.  No headaches, no chest pain abdominal pain.  No recent travel. Takes zofran  only No tick bites/rash No syncope. He reports he works out multiple times per week, and is not keep up with his oral hydration.  He also does shift work and works evenings No known history of CAD or any other medical conditions     Prior to Admission medications   Medication Sig Start Date End Date Taking? Authorizing Provider  ondansetron  (ZOFRAN ) 8 MG tablet Take 1 tablet (8 mg total) by mouth every 8 (eight) hours as needed for nausea or vomiting. 11/12/23   Maranda Jamee Jacob, MD    Allergies: Patient has no known allergies.    Review of Systems  Constitutional:  Positive for fatigue. Negative for fever.  Respiratory:  Positive for shortness of breath.   Cardiovascular:  Negative for chest pain.  Gastrointestinal:  Negative for abdominal pain and blood in stool.  Neurological:  Negative for syncope and headaches.    Updated Vital Signs BP 103/66 (BP Location: Right Arm)   Pulse 87   Temp 98 F (36.7 C)   Resp 19   Ht 1.727 m (5' 8)   Wt 111.1 kg   SpO2 100%   BMI 37.25 kg/m   Physical Exam CONSTITUTIONAL: Well developed/well nourished HEAD: Normocephalic/atraumatic EYES:  EOMI/PERRL, no icterus ENMT: Mucous membranes moist NECK: supple no meningeal signs SPINE/BACK:entire spine nontender CV: S1/S2 noted, no murmurs/rubs/gallops noted LUNGS: Lungs are clear to auscultation bilaterally, no apparent distress ABDOMEN: soft, nontender, no rebound or guarding, bowel sounds noted throughout abdomen GU:no cva tenderness NEURO: Pt is awake/alert/appropriate, moves all extremitiesx4.  No facial droop.  No arm or leg drift.  He is able to ambulate without ataxia EXTREMITIES: pulses normal/equal, full ROM SKIN: warm, color normal PSYCH: no abnormalities of mood noted, alert and oriented to situation  (all labs ordered are listed, but only abnormal results are displayed) Labs Reviewed  COMPREHENSIVE METABOLIC PANEL WITH GFR - Abnormal; Notable for the following components:      Result Value   CO2 21 (*)    Total Protein 8.7 (*)    Albumin 5.1 (*)    Anion gap 16 (*)    All other components within normal limits  URINALYSIS, ROUTINE W REFLEX MICROSCOPIC - Abnormal; Notable for the following components:   Hgb urine dipstick TRACE (*)    Bilirubin Urine SMALL (*)    Ketones, ur 80 (*)    All other components within normal limits  URINALYSIS, MICROSCOPIC (REFLEX) - Abnormal; Notable for the following components:   Bacteria, UA RARE (*)    All other components within normal limits  LIPASE, BLOOD  CBC    EKG: EKG Interpretation Date/Time:  Monday December 08 2023 23:31:05 EDT Ventricular Rate:  82 PR Interval:  133 QRS Duration:  101 QT Interval:  347 QTC Calculation: 406 R Axis:   89  Text Interpretation: Sinus rhythm Confirmed by Midge Golas ([id number]) on 12/08/2023 11:36:48 PM  Radiology: No results found.   Procedures   Medications Ordered in the ED - No data to display                                  Medical Decision Making Amount and/or Complexity of Data Reviewed Labs: ordered. ECG/medicine tests: ordered.   This patient presents to  the ED for concern of fatigue, this involves an extensive number of treatment options, and is a complaint that carries with it a high risk of complications and morbidity.  The differential diagnosis includes but is not limited to CVA, , acute coronary syndrome, renal failure, urinary tract infection, electrolyte disturbance, pneumonia    Social Determinants of Health: Patient's impaired access to primary care  increases the complexity of managing their presentation  Additional history obtained: Additional history obtained from family Records reviewed urgent care notes  Lab Tests: I Ordered, and personally interpreted labs.  The pertinent results include:  dehydration   Reevaluation: After the interventions noted above, I reevaluated the patient and found that they have :improved  Complexity of problems addressed: Patient's presentation is most consistent with  acute presentation with potential threat to life or bodily function  Disposition: After consideration of the diagnostic results and the patient's response to treatment,  I feel that the patent would benefit from discharge  .   Patient well-appearing.  He has had symptoms for a month but at this point is actually feeling improved. We discussed ways to follow-up with PCP.  Discussed need to increase hydration and cut back on his workout regime     Final diagnoses:  Dehydration  Malaise and fatigue    ED Discharge Orders     None          Midge Golas, MD 12/08/23 [id number]

## 2023-12-08 NOTE — ED Notes (Signed)
 AVS provided by edp was reviewed with pt. Pt verbalized understanding with no additional questions at this time. Pt to go home with mother at bedside

## 2023-12-08 NOTE — Discharge Instructions (Signed)
Your caregiver has seen you today because you are having problems with feelings of weakness, dizziness, and/or fatigue. Weakness has many different causes, some of which are common and others are very rare. Your caregiver has considered some of the most common causes of weakness and feels it is safe for you to go home and be observed. Not every illness or injury can be identified during an emergency department visit, thus follow-up with your primary healthcare provider is important. Medical conditions can also worsen, so it is also important to return immediately as directed below, or if you have other serious concerns develop. ° °RETURN IMMEDIATELY IF  °you develop new shortness of breath, chest pain, fever, have difficulty moving parts of your body (new weakness, numbness, or incoordination), sudden change in speech, vision, swallowing, or understanding, faint or develop new dizziness, severe headache, become poorly responsive or have an altered mental status compared to baseline for you, new rash, abdominal pain, or bloody stools,  °Return sooner also if you develop new problems for which you have not talked to your caregiver but you feel may be emergency medical conditions. ° °

## 2023-12-08 NOTE — ED Triage Notes (Signed)
 Pt reports food poisoning a month ago, has had residual dyspnea with exertion, fatigue, weight loss, n/v/d, last emesis was this AM
